# Patient Record
Sex: Male | Born: 2000 | Race: White | Hispanic: No | Marital: Single
Health system: Southern US, Community
[De-identification: ages and names within clinical notes are randomized; demographics above are authoritative.]

## PROBLEM LIST (undated history)

## (undated) DIAGNOSIS — T7840XA Allergy, unspecified, initial encounter: Secondary | ICD-10-CM

## (undated) DIAGNOSIS — S83512A Sprain of anterior cruciate ligament of left knee, initial encounter: Secondary | ICD-10-CM

## (undated) DIAGNOSIS — L309 Dermatitis, unspecified: Secondary | ICD-10-CM

## (undated) DIAGNOSIS — J45909 Unspecified asthma, uncomplicated: Secondary | ICD-10-CM

## (undated) DIAGNOSIS — IMO0001 Reserved for inherently not codable concepts without codable children: Secondary | ICD-10-CM

## (undated) DIAGNOSIS — Z00111 Health examination for newborn 8 to 28 days old: Secondary | ICD-10-CM

## (undated) DIAGNOSIS — S83207A Unspecified tear of unspecified meniscus, current injury, left knee, initial encounter: Secondary | ICD-10-CM

## (undated) HISTORY — DX: Reserved for inherently not codable concepts without codable children: IMO0001

## (undated) HISTORY — DX: Unspecified asthma, uncomplicated: J45.909

## (undated) HISTORY — DX: Health examination for newborn 8 to 28 days old: Z00.111

## (undated) HISTORY — PX: OTHER SURGICAL HISTORY: SHX169

## (undated) HISTORY — DX: Allergy, unspecified, initial encounter: T78.40XA

## (undated) HISTORY — DX: Dermatitis, unspecified: L30.9

---
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
## 2021-12-22 ENCOUNTER — Other Ambulatory Visit (HOSPITAL_BASED_OUTPATIENT_CLINIC_OR_DEPARTMENT_OTHER): Payer: Self-pay

## 2021-12-22 MED ORDER — OSELTAMIVIR PHOSPHATE 75 MG PO CAPS
ORAL_CAPSULE | ORAL | 0 refills | Status: AC
  Filled 2021-12-22: qty 10, 5d supply, fill #0

## 2021-12-24 ENCOUNTER — Other Ambulatory Visit (HOSPITAL_BASED_OUTPATIENT_CLINIC_OR_DEPARTMENT_OTHER): Payer: Self-pay

## 2021-12-31 ENCOUNTER — Other Ambulatory Visit (HOSPITAL_BASED_OUTPATIENT_CLINIC_OR_DEPARTMENT_OTHER): Payer: Self-pay

## 2022-02-17 ENCOUNTER — Other Ambulatory Visit (HOSPITAL_BASED_OUTPATIENT_CLINIC_OR_DEPARTMENT_OTHER): Payer: Self-pay

## 2022-02-17 MED ORDER — AZITHROMYCIN 250 MG PO TABS
ORAL_TABLET | ORAL | 0 refills | Status: AC
  Filled 2022-02-17: qty 6, 5d supply, fill #0

## 2022-02-17 MED ORDER — PREDNISONE 20 MG PO TABS
ORAL_TABLET | ORAL | 0 refills | Status: AC
  Filled 2022-02-17: qty 12, 6d supply, fill #0

## 2023-01-20 ENCOUNTER — Other Ambulatory Visit (HOSPITAL_BASED_OUTPATIENT_CLINIC_OR_DEPARTMENT_OTHER): Payer: Self-pay

## 2024-12-09 ENCOUNTER — Other Ambulatory Visit (HOSPITAL_BASED_OUTPATIENT_CLINIC_OR_DEPARTMENT_OTHER): Payer: Self-pay

## 2024-12-09 MED ORDER — IBUPROFEN 800 MG PO TABS
800.0000 mg | ORAL_TABLET | Freq: Four times a day (QID) | ORAL | 0 refills | Status: AC | PRN
  Filled 2024-12-09: qty 30, 8d supply, fill #0

## 2024-12-20 ENCOUNTER — Other Ambulatory Visit (HOSPITAL_BASED_OUTPATIENT_CLINIC_OR_DEPARTMENT_OTHER): Payer: Self-pay
